# Patient Record
Sex: Female | Born: 1970 | Race: White | Hispanic: No | Marital: Single
Health system: Southern US, Community
[De-identification: ages and names within clinical notes are randomized; demographics above are authoritative.]

---

## 2012-10-09 ENCOUNTER — Other Ambulatory Visit (HOSPITAL_COMMUNITY)
Admission: RE | Admit: 2012-10-09 | Discharge: 2012-10-09 | Disposition: A | Discharge status: Home / Self Care | Payer: 59 | Source: Ambulatory Visit | Attending: Family Medicine | Admitting: Family Medicine

## 2012-10-09 ENCOUNTER — Other Ambulatory Visit: Payer: Self-pay | Admitting: Family Medicine

## 2012-10-09 DIAGNOSIS — Z124 Encounter for screening for malignant neoplasm of cervix: Secondary | ICD-10-CM | POA: Insufficient documentation

## 2012-10-09 DIAGNOSIS — Z1151 Encounter for screening for human papillomavirus (HPV): Secondary | ICD-10-CM | POA: Insufficient documentation

## 2012-10-10 ENCOUNTER — Other Ambulatory Visit: Payer: Self-pay | Admitting: Family Medicine

## 2012-10-10 DIAGNOSIS — R928 Other abnormal and inconclusive findings on diagnostic imaging of breast: Secondary | ICD-10-CM

## 2012-10-26 ENCOUNTER — Ambulatory Visit
Admission: RE | Admit: 2012-10-26 | Discharge: 2012-10-26 | Disposition: A | Discharge status: Home / Self Care | Payer: 59 | Source: Ambulatory Visit | Attending: Family Medicine | Admitting: Family Medicine

## 2012-10-26 DIAGNOSIS — R928 Other abnormal and inconclusive findings on diagnostic imaging of breast: Secondary | ICD-10-CM

## 2013-10-15 ENCOUNTER — Other Ambulatory Visit: Payer: Self-pay | Admitting: Family Medicine

## 2013-10-16 ENCOUNTER — Other Ambulatory Visit: Payer: Self-pay | Admitting: Family Medicine

## 2013-10-16 DIAGNOSIS — R921 Mammographic calcification found on diagnostic imaging of breast: Secondary | ICD-10-CM

## 2013-10-21 ENCOUNTER — Ambulatory Visit
Admission: RE | Admit: 2013-10-21 | Discharge: 2013-10-21 | Disposition: A | Discharge status: Home / Self Care | Payer: 59 | Source: Ambulatory Visit | Attending: Family Medicine | Admitting: Family Medicine

## 2013-10-21 ENCOUNTER — Encounter (INDEPENDENT_AMBULATORY_CARE_PROVIDER_SITE_OTHER): Payer: Self-pay

## 2013-10-21 ENCOUNTER — Other Ambulatory Visit: Payer: Self-pay | Admitting: Family Medicine

## 2013-10-21 DIAGNOSIS — R921 Mammographic calcification found on diagnostic imaging of breast: Secondary | ICD-10-CM

## 2014-04-27 IMAGING — MG MM DIAGNOSTIC UNILATERAL R
3 series · 3 of 3 positions shown · non-contrast
Comparison: 08/24/2012.

CLINICAL DATA: Called back from baseline mobile screening
mammography, right breast calcifications only visualized in the MLO
projection.

DIGITAL DIAGNOSTIC RIGHT MAMMOGRAM WITH CAD

[R CC]
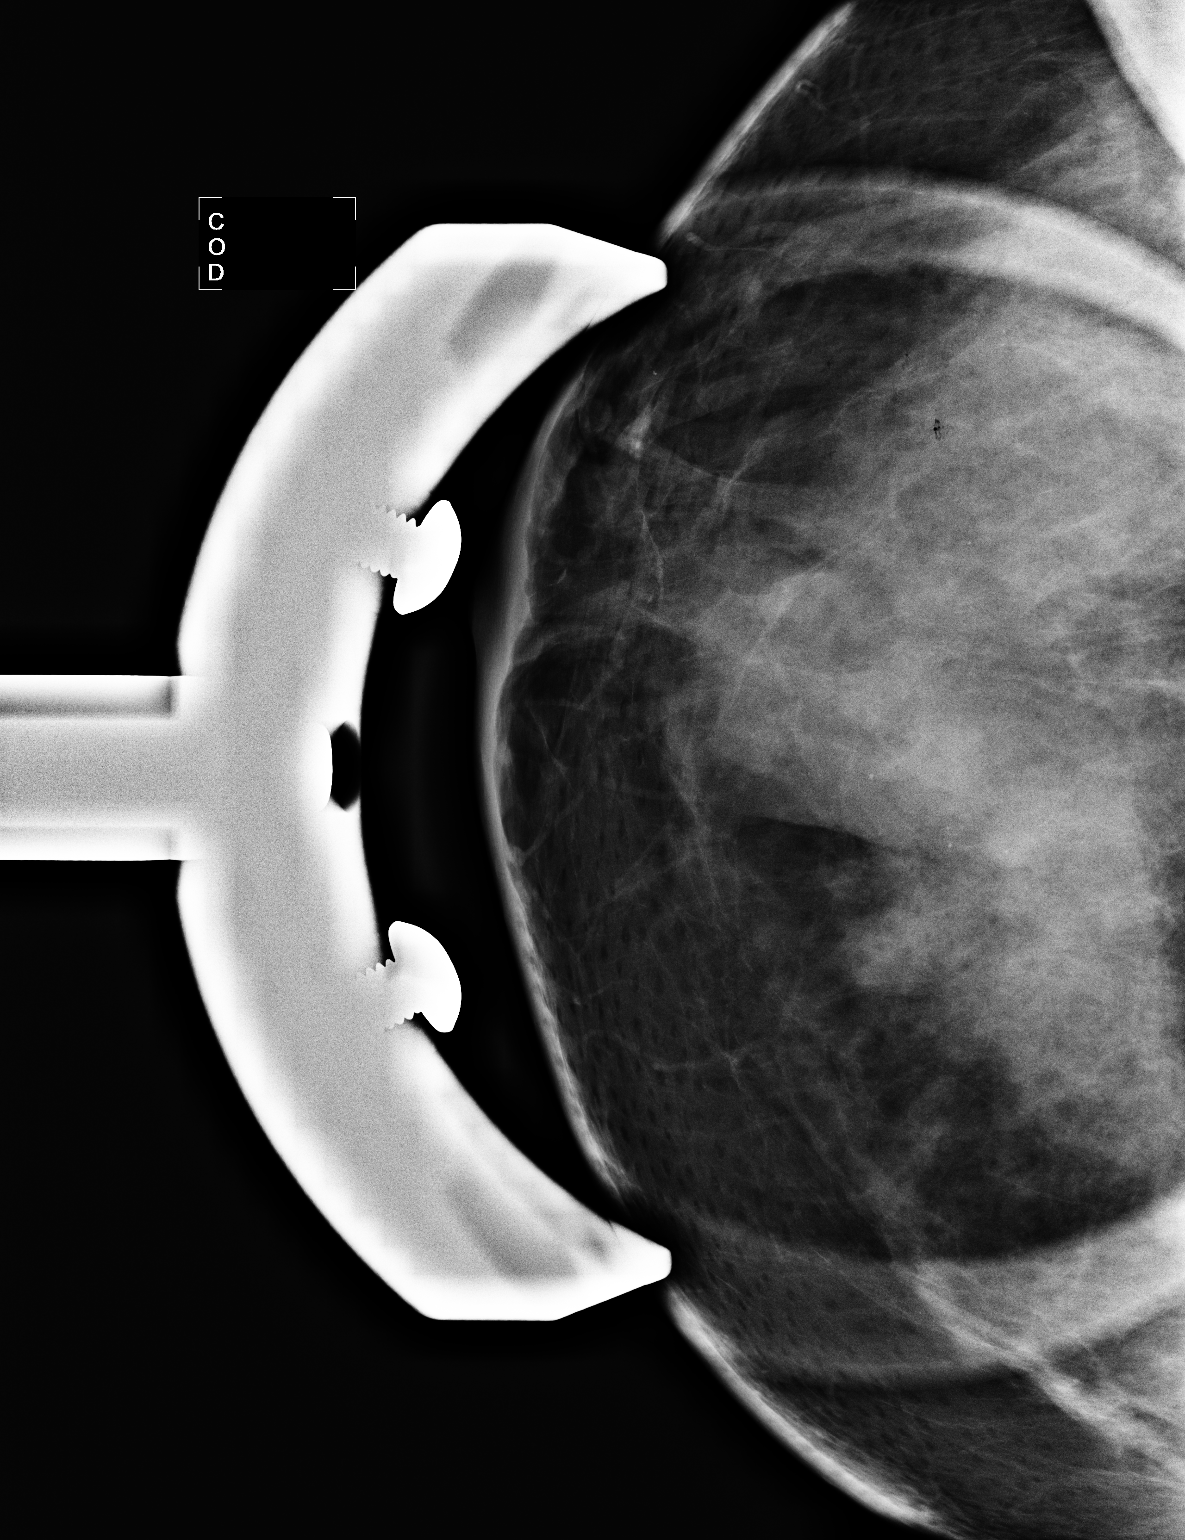

[R ML]
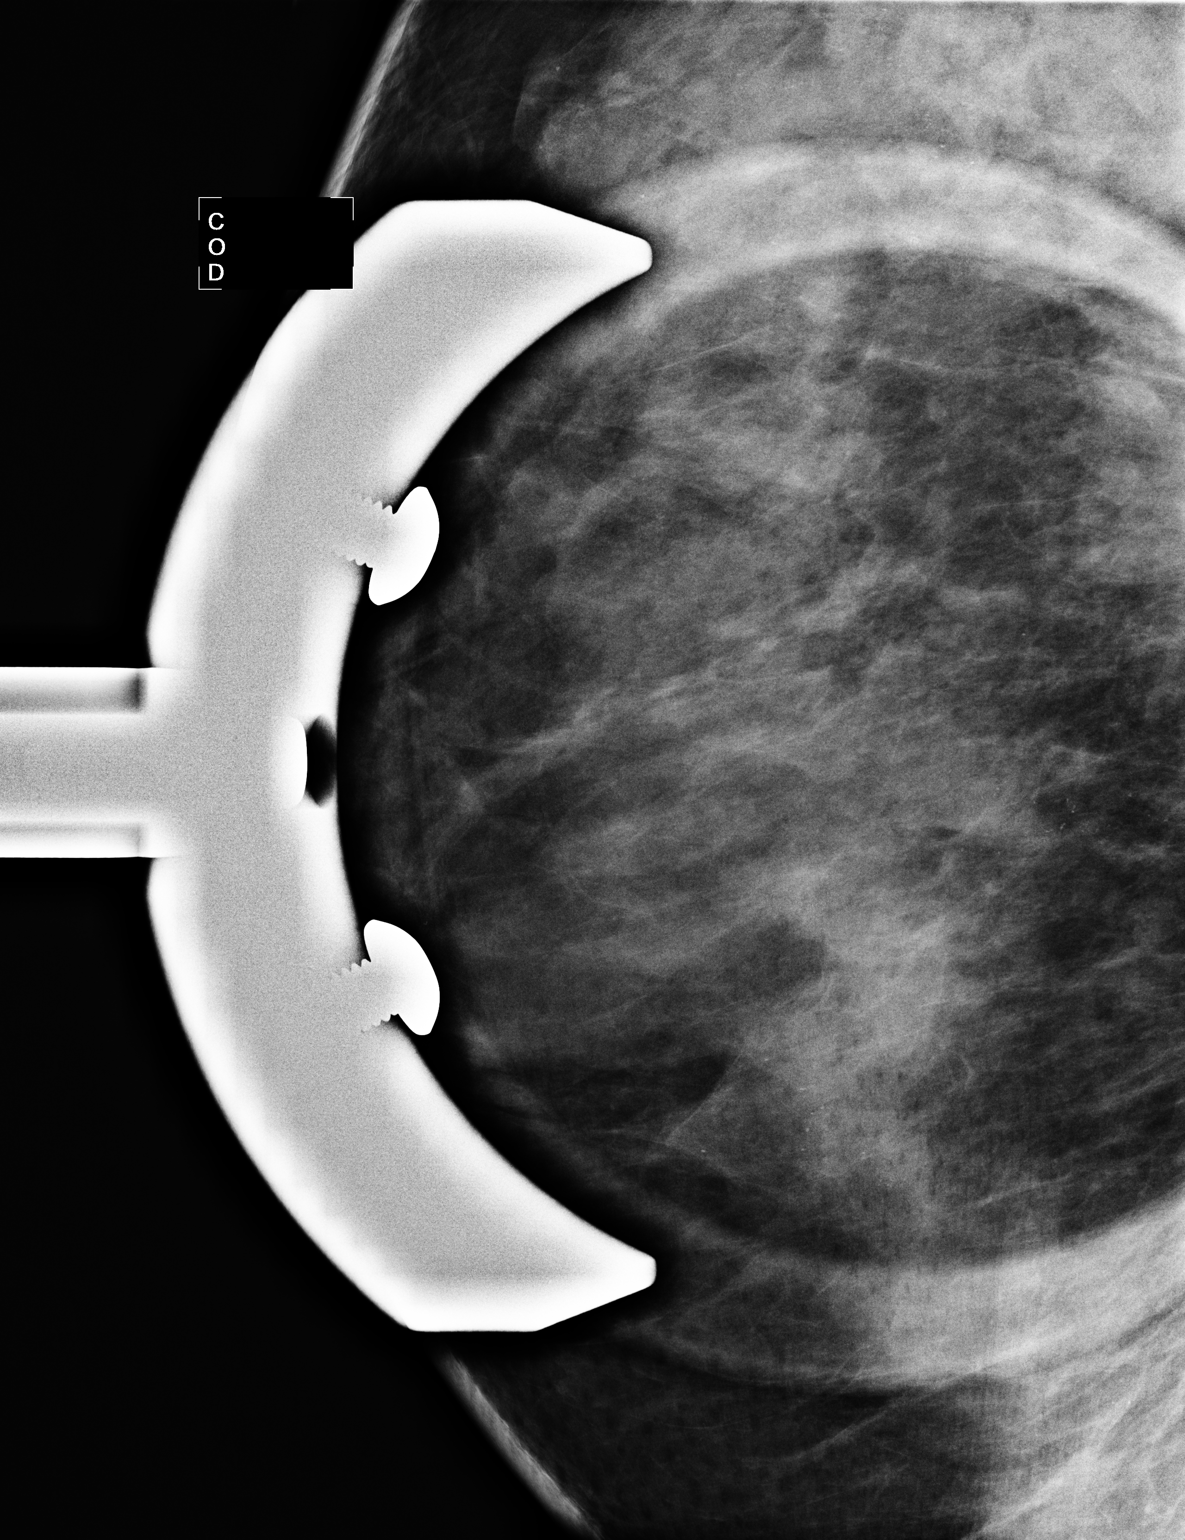

[R XCCL]
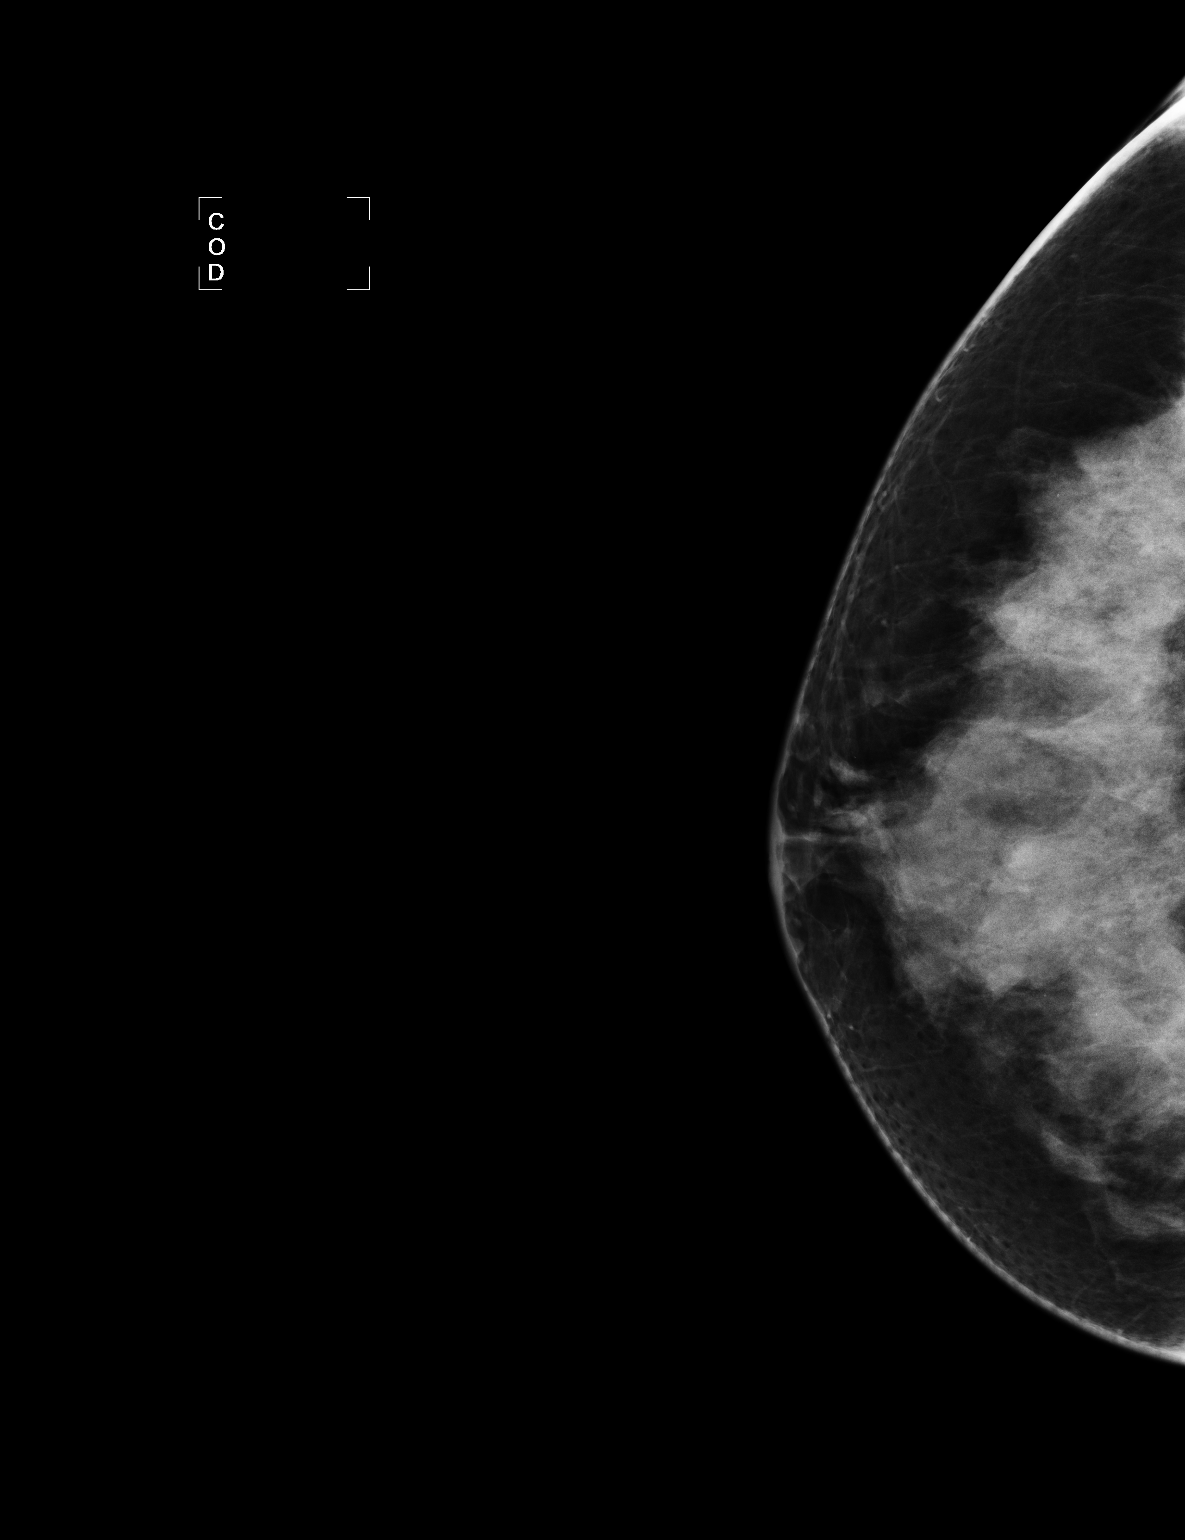

[3 of 3 positions shown; findings below may reference images not displayed]

FINDINGS: ACR Breast Density Category d:  The breast tissue is extremely
dense, which lowers the sensitivity of mammography.

Spot magnification CC and ML views of the calcifications in the
posterior third of the retroareolar right breast and a laterally
exaggerated CC view of the right breast were obtained.  The
calcifications are loosely grouped, faint and punctate on the CC
view, and many demonstrate layering on the true lateral view.  I do
not identify suspicious linear or branching forms.

The laterally exaggerated CC view was processed with CAD.
IMPRESSION: Likely benign calcifications in the posterior third of the
retroareolar right breast.

RECOMMENDATION:
Diagnostic right mammogram and spot magnification views of the
calcifications in 6 months.

I have discussed the findings and recommendations with the patient.
Results were also provided in writing at the conclusion of the
visit.  If applicable, a reminder letter will be sent to the
patient regarding her next appointment.

BI-RADS CATEGORY 3:  Probably benign finding(s) - short interval
follow-up suggested.

## 2014-10-23 ENCOUNTER — Other Ambulatory Visit: Payer: Self-pay | Admitting: Family Medicine

## 2014-10-23 DIAGNOSIS — R921 Mammographic calcification found on diagnostic imaging of breast: Secondary | ICD-10-CM

## 2014-10-24 ENCOUNTER — Ambulatory Visit
Admission: RE | Admit: 2014-10-24 | Discharge: 2014-10-24 | Disposition: A | Discharge status: Home / Self Care | Payer: 59 | Source: Ambulatory Visit | Attending: Family Medicine | Admitting: Family Medicine

## 2014-10-24 DIAGNOSIS — R921 Mammographic calcification found on diagnostic imaging of breast: Secondary | ICD-10-CM

## 2016-04-05 DIAGNOSIS — Z719 Counseling, unspecified: Secondary | ICD-10-CM | POA: Diagnosis not present

## 2016-09-20 ENCOUNTER — Other Ambulatory Visit: Payer: Self-pay | Admitting: Family Medicine

## 2016-09-20 DIAGNOSIS — Z1231 Encounter for screening mammogram for malignant neoplasm of breast: Secondary | ICD-10-CM

## 2016-09-30 ENCOUNTER — Ambulatory Visit
Admission: RE | Admit: 2016-09-30 | Discharge: 2016-09-30 | Disposition: A | Discharge status: Home / Self Care | Payer: 59 | Source: Ambulatory Visit | Attending: Family Medicine | Admitting: Family Medicine

## 2016-09-30 DIAGNOSIS — Z1231 Encounter for screening mammogram for malignant neoplasm of breast: Secondary | ICD-10-CM

## 2016-10-19 DIAGNOSIS — J069 Acute upper respiratory infection, unspecified: Secondary | ICD-10-CM | POA: Diagnosis not present

## 2016-10-28 DIAGNOSIS — Z0000 Encounter for general adult medical examination without abnormal findings: Secondary | ICD-10-CM | POA: Diagnosis not present

## 2016-12-02 DIAGNOSIS — Z01411 Encounter for gynecological examination (general) (routine) with abnormal findings: Secondary | ICD-10-CM | POA: Diagnosis not present

## 2017-04-28 DIAGNOSIS — M722 Plantar fascial fibromatosis: Secondary | ICD-10-CM | POA: Diagnosis not present

## 2017-04-28 DIAGNOSIS — J310 Chronic rhinitis: Secondary | ICD-10-CM | POA: Diagnosis not present

## 2017-08-21 DIAGNOSIS — M542 Cervicalgia: Secondary | ICD-10-CM | POA: Diagnosis not present

## 2017-08-21 DIAGNOSIS — M9901 Segmental and somatic dysfunction of cervical region: Secondary | ICD-10-CM | POA: Diagnosis not present

## 2017-08-21 DIAGNOSIS — M9902 Segmental and somatic dysfunction of thoracic region: Secondary | ICD-10-CM | POA: Diagnosis not present

## 2017-08-24 DIAGNOSIS — M9902 Segmental and somatic dysfunction of thoracic region: Secondary | ICD-10-CM | POA: Diagnosis not present

## 2017-08-24 DIAGNOSIS — M542 Cervicalgia: Secondary | ICD-10-CM | POA: Diagnosis not present

## 2017-08-24 DIAGNOSIS — M9901 Segmental and somatic dysfunction of cervical region: Secondary | ICD-10-CM | POA: Diagnosis not present

## 2017-08-29 DIAGNOSIS — M9902 Segmental and somatic dysfunction of thoracic region: Secondary | ICD-10-CM | POA: Diagnosis not present

## 2017-08-29 DIAGNOSIS — M542 Cervicalgia: Secondary | ICD-10-CM | POA: Diagnosis not present

## 2017-08-29 DIAGNOSIS — M9901 Segmental and somatic dysfunction of cervical region: Secondary | ICD-10-CM | POA: Diagnosis not present

## 2017-08-31 DIAGNOSIS — M542 Cervicalgia: Secondary | ICD-10-CM | POA: Diagnosis not present

## 2017-08-31 DIAGNOSIS — M9901 Segmental and somatic dysfunction of cervical region: Secondary | ICD-10-CM | POA: Diagnosis not present

## 2017-08-31 DIAGNOSIS — M9902 Segmental and somatic dysfunction of thoracic region: Secondary | ICD-10-CM | POA: Diagnosis not present

## 2017-09-05 DIAGNOSIS — M542 Cervicalgia: Secondary | ICD-10-CM | POA: Diagnosis not present

## 2017-09-05 DIAGNOSIS — M9901 Segmental and somatic dysfunction of cervical region: Secondary | ICD-10-CM | POA: Diagnosis not present

## 2017-09-05 DIAGNOSIS — M9902 Segmental and somatic dysfunction of thoracic region: Secondary | ICD-10-CM | POA: Diagnosis not present

## 2017-09-07 DIAGNOSIS — M9902 Segmental and somatic dysfunction of thoracic region: Secondary | ICD-10-CM | POA: Diagnosis not present

## 2017-09-07 DIAGNOSIS — M9901 Segmental and somatic dysfunction of cervical region: Secondary | ICD-10-CM | POA: Diagnosis not present

## 2017-09-07 DIAGNOSIS — M542 Cervicalgia: Secondary | ICD-10-CM | POA: Diagnosis not present

## 2017-09-12 DIAGNOSIS — M9901 Segmental and somatic dysfunction of cervical region: Secondary | ICD-10-CM | POA: Diagnosis not present

## 2017-09-12 DIAGNOSIS — M542 Cervicalgia: Secondary | ICD-10-CM | POA: Diagnosis not present

## 2017-09-12 DIAGNOSIS — M9902 Segmental and somatic dysfunction of thoracic region: Secondary | ICD-10-CM | POA: Diagnosis not present

## 2017-09-14 DIAGNOSIS — M9902 Segmental and somatic dysfunction of thoracic region: Secondary | ICD-10-CM | POA: Diagnosis not present

## 2017-09-14 DIAGNOSIS — M9901 Segmental and somatic dysfunction of cervical region: Secondary | ICD-10-CM | POA: Diagnosis not present

## 2017-09-14 DIAGNOSIS — M542 Cervicalgia: Secondary | ICD-10-CM | POA: Diagnosis not present

## 2017-09-19 DIAGNOSIS — M9901 Segmental and somatic dysfunction of cervical region: Secondary | ICD-10-CM | POA: Diagnosis not present

## 2017-09-19 DIAGNOSIS — M542 Cervicalgia: Secondary | ICD-10-CM | POA: Diagnosis not present

## 2017-09-19 DIAGNOSIS — R293 Abnormal posture: Secondary | ICD-10-CM | POA: Diagnosis not present

## 2017-09-19 DIAGNOSIS — M546 Pain in thoracic spine: Secondary | ICD-10-CM | POA: Diagnosis not present

## 2017-09-19 DIAGNOSIS — M9902 Segmental and somatic dysfunction of thoracic region: Secondary | ICD-10-CM | POA: Diagnosis not present

## 2017-09-21 DIAGNOSIS — M546 Pain in thoracic spine: Secondary | ICD-10-CM | POA: Diagnosis not present

## 2017-09-21 DIAGNOSIS — R293 Abnormal posture: Secondary | ICD-10-CM | POA: Diagnosis not present

## 2017-09-21 DIAGNOSIS — M9902 Segmental and somatic dysfunction of thoracic region: Secondary | ICD-10-CM | POA: Diagnosis not present

## 2017-09-26 DIAGNOSIS — M9902 Segmental and somatic dysfunction of thoracic region: Secondary | ICD-10-CM | POA: Diagnosis not present

## 2017-09-26 DIAGNOSIS — R293 Abnormal posture: Secondary | ICD-10-CM | POA: Diagnosis not present

## 2017-09-26 DIAGNOSIS — M546 Pain in thoracic spine: Secondary | ICD-10-CM | POA: Diagnosis not present

## 2017-10-05 DIAGNOSIS — M9902 Segmental and somatic dysfunction of thoracic region: Secondary | ICD-10-CM | POA: Diagnosis not present

## 2017-10-05 DIAGNOSIS — R293 Abnormal posture: Secondary | ICD-10-CM | POA: Diagnosis not present

## 2017-10-05 DIAGNOSIS — M546 Pain in thoracic spine: Secondary | ICD-10-CM | POA: Diagnosis not present

## 2017-11-03 DIAGNOSIS — Z0000 Encounter for general adult medical examination without abnormal findings: Secondary | ICD-10-CM | POA: Diagnosis not present

## 2017-11-03 DIAGNOSIS — Z136 Encounter for screening for cardiovascular disorders: Secondary | ICD-10-CM | POA: Diagnosis not present

## 2017-12-20 ENCOUNTER — Other Ambulatory Visit: Payer: Self-pay | Admitting: Family Medicine

## 2017-12-20 DIAGNOSIS — Z1231 Encounter for screening mammogram for malignant neoplasm of breast: Secondary | ICD-10-CM

## 2018-01-03 ENCOUNTER — Ambulatory Visit
Admission: RE | Admit: 2018-01-03 | Discharge: 2018-01-03 | Disposition: A | Discharge status: Home / Self Care | Payer: 59 | Source: Ambulatory Visit | Attending: Family Medicine | Admitting: Family Medicine

## 2018-01-03 DIAGNOSIS — Z1231 Encounter for screening mammogram for malignant neoplasm of breast: Secondary | ICD-10-CM

## 2019-12-24 ENCOUNTER — Other Ambulatory Visit: Payer: Self-pay | Admitting: Family Medicine

## 2019-12-24 DIAGNOSIS — Z1231 Encounter for screening mammogram for malignant neoplasm of breast: Secondary | ICD-10-CM

## 2019-12-27 ENCOUNTER — Other Ambulatory Visit: Payer: Self-pay

## 2019-12-27 ENCOUNTER — Ambulatory Visit
Admission: RE | Admit: 2019-12-27 | Discharge: 2019-12-27 | Disposition: A | Discharge status: Home / Self Care | Payer: PRIVATE HEALTH INSURANCE | Source: Ambulatory Visit | Attending: Family Medicine | Admitting: Family Medicine

## 2019-12-27 DIAGNOSIS — Z1231 Encounter for screening mammogram for malignant neoplasm of breast: Secondary | ICD-10-CM

## 2020-01-01 ENCOUNTER — Other Ambulatory Visit: Payer: Self-pay | Admitting: Family Medicine

## 2020-01-01 DIAGNOSIS — R928 Other abnormal and inconclusive findings on diagnostic imaging of breast: Secondary | ICD-10-CM

## 2020-01-15 ENCOUNTER — Other Ambulatory Visit: Payer: Self-pay

## 2020-01-15 ENCOUNTER — Ambulatory Visit
Admission: RE | Admit: 2020-01-15 | Discharge: 2020-01-15 | Disposition: A | Discharge status: Home / Self Care | Payer: PRIVATE HEALTH INSURANCE | Source: Ambulatory Visit | Attending: Family Medicine | Admitting: Family Medicine

## 2020-01-15 DIAGNOSIS — R928 Other abnormal and inconclusive findings on diagnostic imaging of breast: Secondary | ICD-10-CM

## 2021-01-12 ENCOUNTER — Other Ambulatory Visit: Payer: Self-pay | Admitting: Emergency Medicine

## 2021-01-12 DIAGNOSIS — Z1231 Encounter for screening mammogram for malignant neoplasm of breast: Secondary | ICD-10-CM

## 2021-02-12 ENCOUNTER — Ambulatory Visit
Admission: RE | Admit: 2021-02-12 | Discharge: 2021-02-12 | Disposition: A | Discharge status: Home / Self Care | Payer: PRIVATE HEALTH INSURANCE | Source: Ambulatory Visit | Attending: Emergency Medicine | Admitting: Emergency Medicine

## 2021-02-12 ENCOUNTER — Other Ambulatory Visit: Payer: Self-pay

## 2021-02-12 DIAGNOSIS — Z1231 Encounter for screening mammogram for malignant neoplasm of breast: Secondary | ICD-10-CM

## 2021-02-17 ENCOUNTER — Other Ambulatory Visit: Payer: Self-pay | Admitting: Emergency Medicine

## 2021-02-17 DIAGNOSIS — R928 Other abnormal and inconclusive findings on diagnostic imaging of breast: Secondary | ICD-10-CM

## 2021-03-12 ENCOUNTER — Ambulatory Visit
Admission: RE | Admit: 2021-03-12 | Discharge: 2021-03-12 | Disposition: A | Discharge status: Home / Self Care | Payer: PRIVATE HEALTH INSURANCE | Source: Ambulatory Visit | Attending: Emergency Medicine | Admitting: Emergency Medicine

## 2021-03-12 ENCOUNTER — Other Ambulatory Visit: Payer: Self-pay

## 2021-03-12 DIAGNOSIS — R928 Other abnormal and inconclusive findings on diagnostic imaging of breast: Secondary | ICD-10-CM

## 2022-06-21 ENCOUNTER — Other Ambulatory Visit: Payer: Self-pay | Admitting: Emergency Medicine

## 2022-06-21 DIAGNOSIS — Z1231 Encounter for screening mammogram for malignant neoplasm of breast: Secondary | ICD-10-CM

## 2022-07-07 ENCOUNTER — Ambulatory Visit
Admission: RE | Admit: 2022-07-07 | Discharge: 2022-07-07 | Disposition: A | Discharge status: Home / Self Care | Payer: PRIVATE HEALTH INSURANCE | Source: Ambulatory Visit | Attending: Emergency Medicine | Admitting: Emergency Medicine

## 2022-07-07 DIAGNOSIS — Z1231 Encounter for screening mammogram for malignant neoplasm of breast: Secondary | ICD-10-CM

## 2022-07-12 ENCOUNTER — Other Ambulatory Visit: Payer: Self-pay | Admitting: Emergency Medicine

## 2022-07-12 DIAGNOSIS — R928 Other abnormal and inconclusive findings on diagnostic imaging of breast: Secondary | ICD-10-CM

## 2022-07-22 ENCOUNTER — Ambulatory Visit
Admission: RE | Admit: 2022-07-22 | Discharge: 2022-07-22 | Disposition: A | Discharge status: Home / Self Care | Payer: PRIVATE HEALTH INSURANCE | Source: Ambulatory Visit | Attending: Emergency Medicine | Admitting: Emergency Medicine

## 2022-07-22 ENCOUNTER — Ambulatory Visit: Payer: PRIVATE HEALTH INSURANCE

## 2022-07-22 DIAGNOSIS — R928 Other abnormal and inconclusive findings on diagnostic imaging of breast: Secondary | ICD-10-CM

## 2022-09-11 IMAGING — MG MM DIGITAL DIAGNOSTIC UNILAT*R*
3 series · 3 of 3 positions shown · non-contrast
Comparison: Previous exam(s).

CLINICAL DATA: Screening recall for right breast calcifications.

EXAM:
DIGITAL DIAGNOSTIC UNILATERAL RIGHT MAMMOGRAM
TECHNIQUE: Right digital diagnostic mammography was performed. Mammographic
images were processed with CAD.

[R ML (1 of 2)]
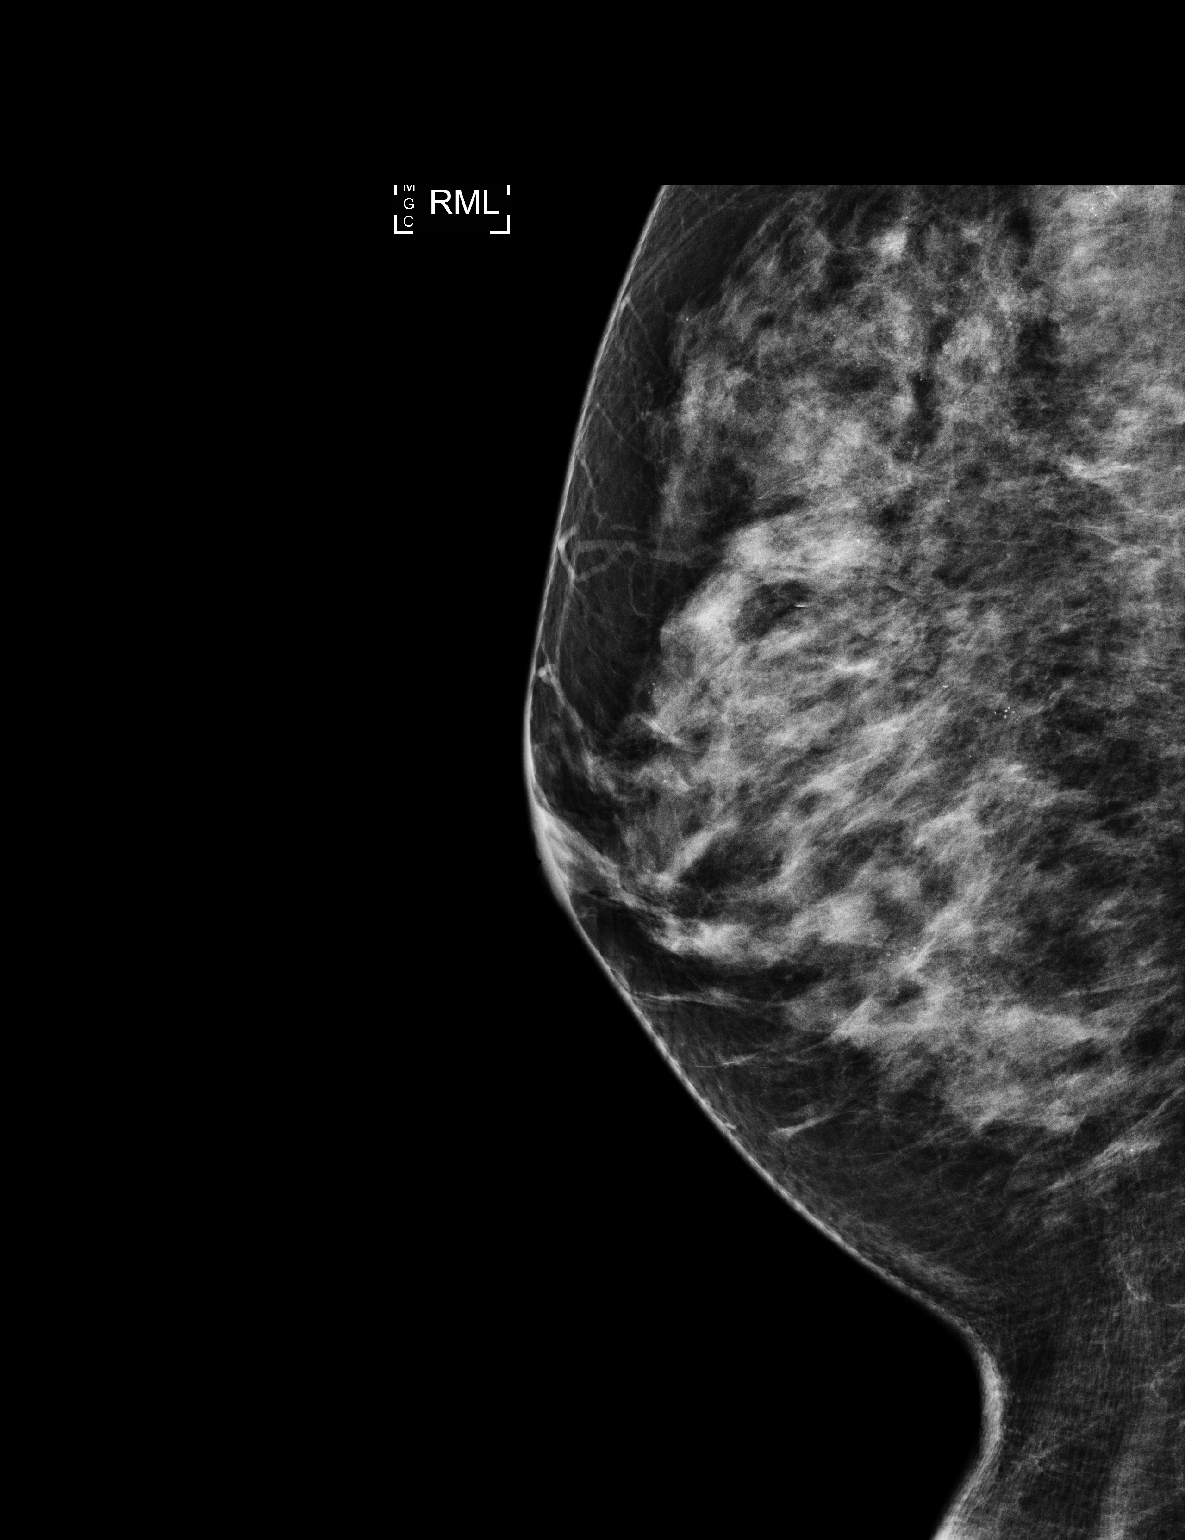

[R ML (2 of 2)]
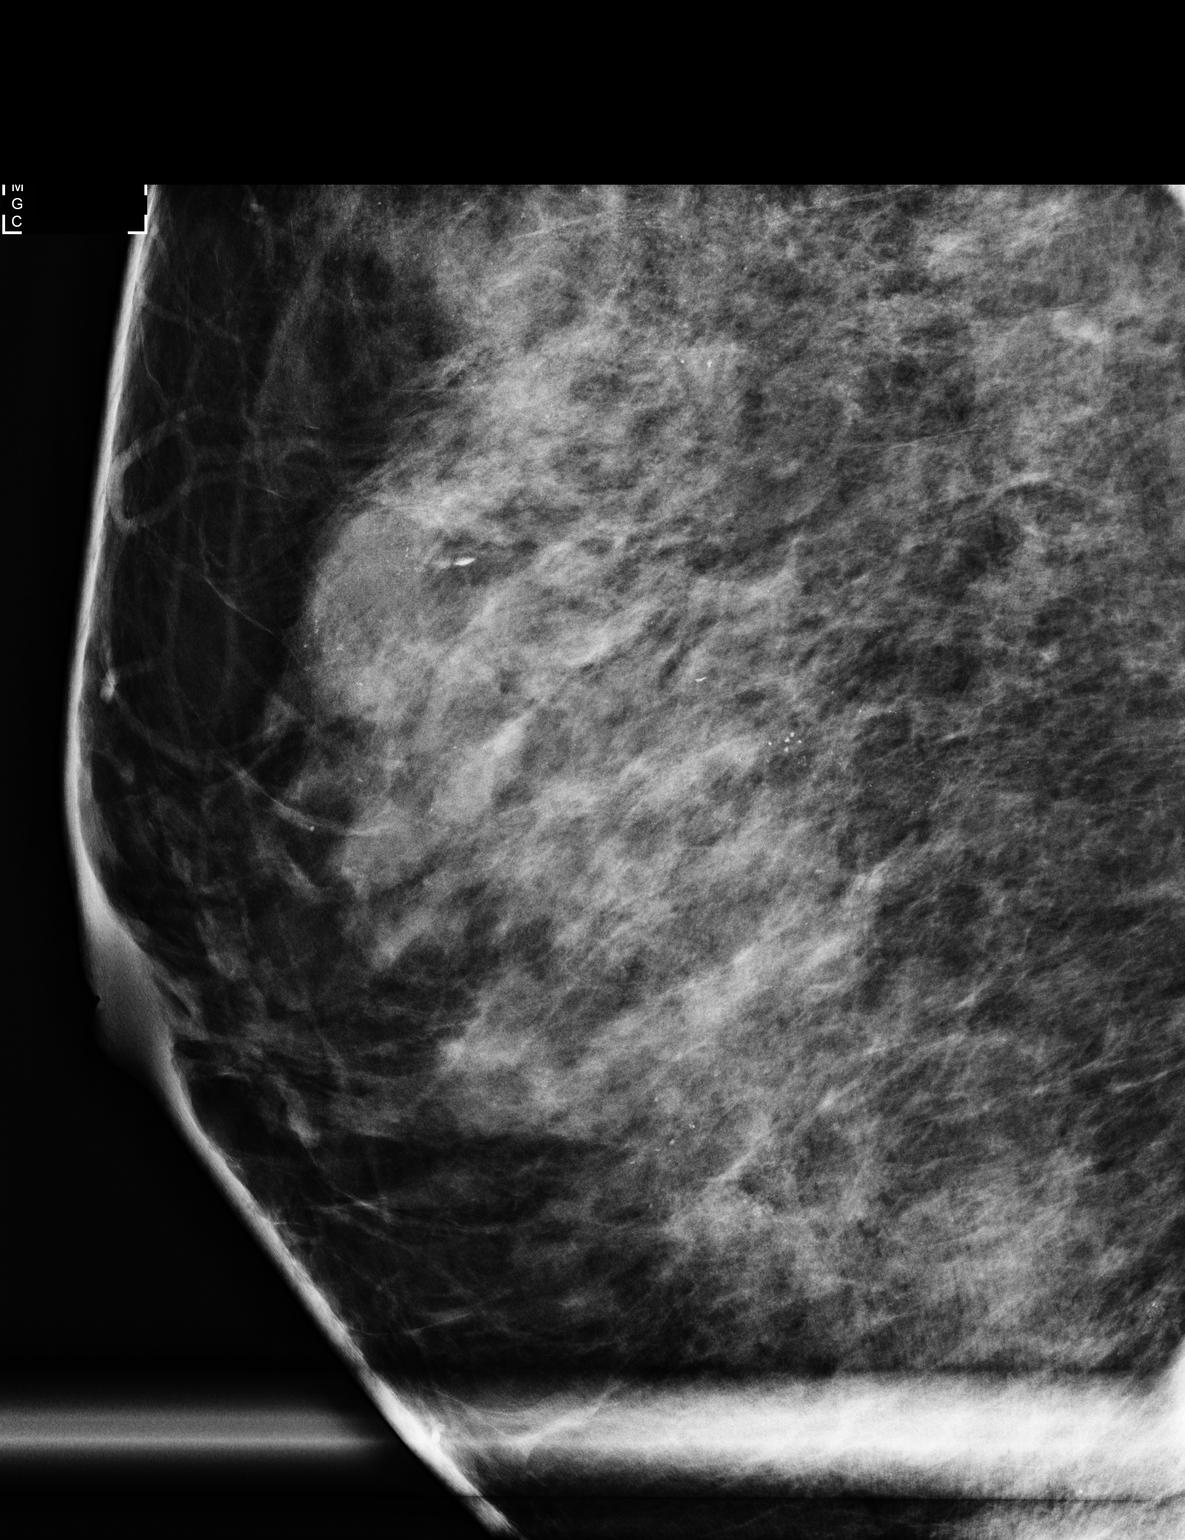

[R CC]
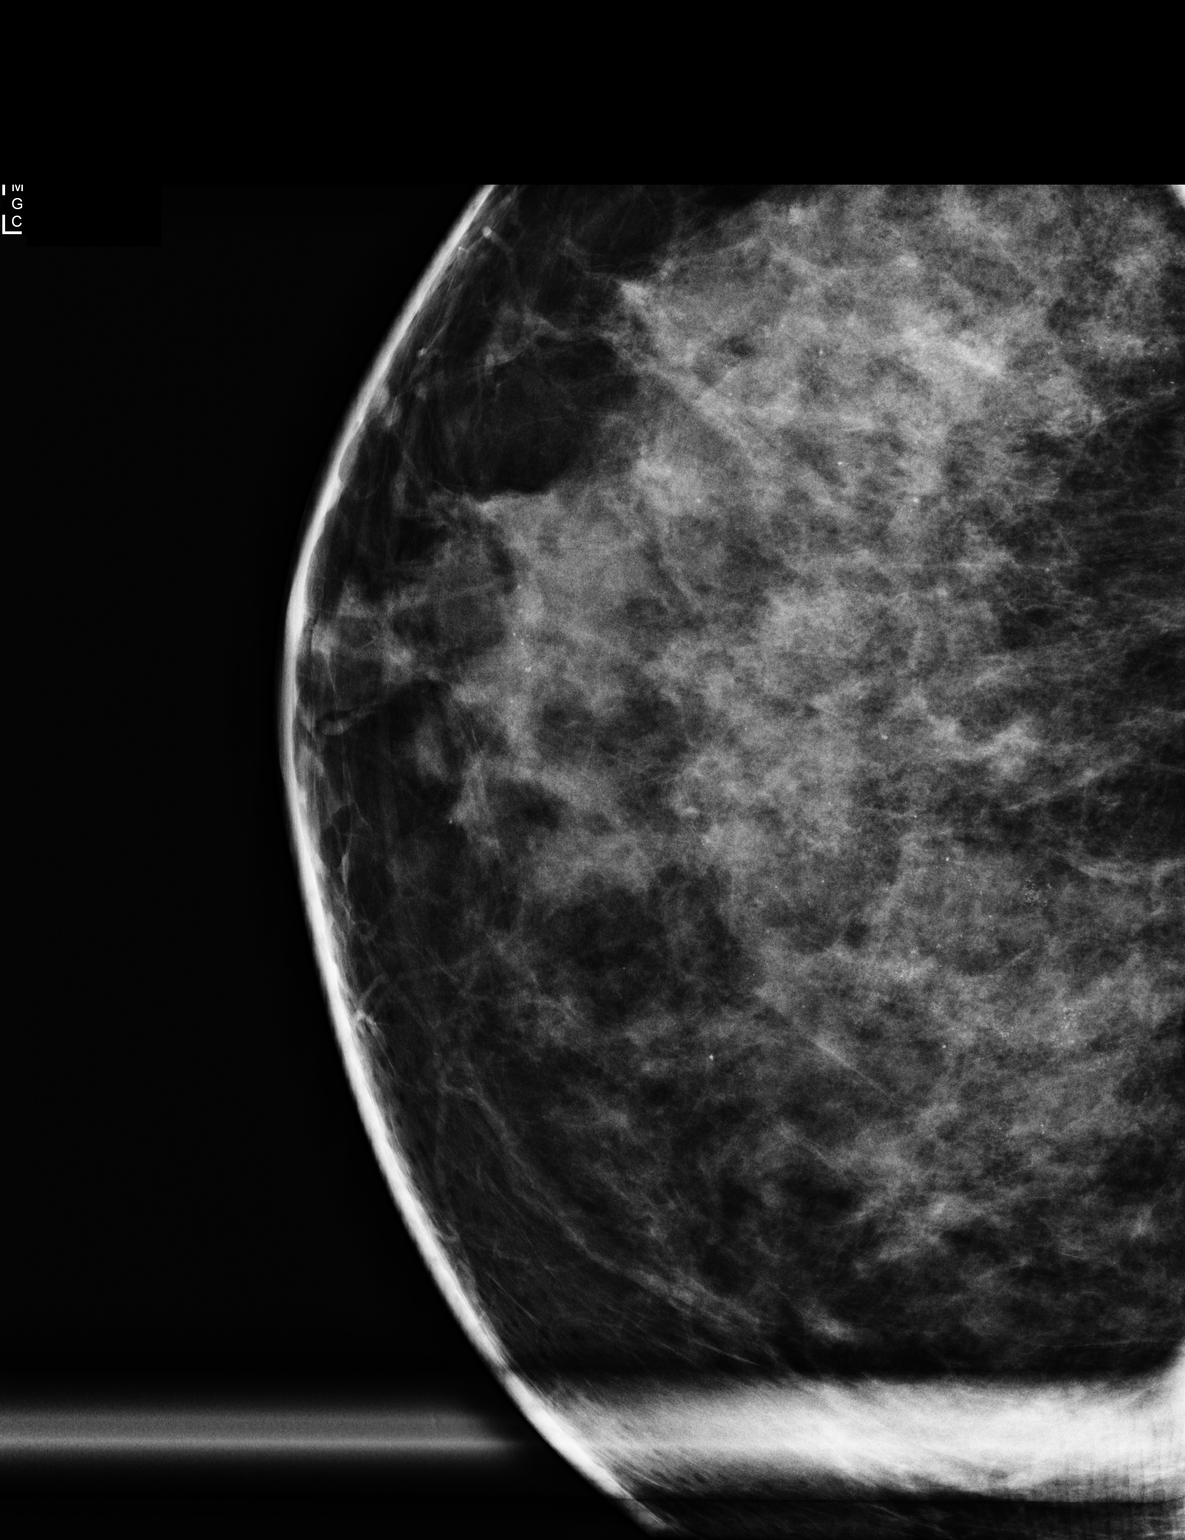

[3 of 3 positions shown; findings below may reference images not displayed]

ACR Breast Density Category c: The breast tissue is heterogeneously
dense, which may obscure small masses.
FINDINGS: The calcifications in the right breast are better imaged with the
magnification views. There are numerous calcifications scattered
throughout the fibroglandular tissue of the right breast, also seen
to a lesser degree on the left. The area of calcifications the
retroareolar breast are small round and punctate in configuration,
with no associated mass or distortion and similar to numerous other
calcifications seen elsewhere. The calcifications in the posterior
aspect of the breast noted on the screening study are also small
round calcifications with no associated mass or distortion and
similar to numerous other calcifications. On the mL images, some of
the right breast calcifications layer consistent with milk of
calcium. Overall, there has been no significant change when compared
to the most recent prior study and the 3753 exam.
IMPRESSION: 1. No evidence of breast malignancy.
2. Benign bilateral breast calcifications.

RECOMMENDATION:
Screening mammogram in one year.(Code:[id number])

I have discussed the findings and recommendations with the patient.
If applicable, a reminder letter will be sent to the patient
regarding the next appointment.

BI-RADS CATEGORY  2: Benign.

## 2023-07-27 ENCOUNTER — Other Ambulatory Visit: Payer: Self-pay | Admitting: Emergency Medicine

## 2023-07-27 DIAGNOSIS — Z1231 Encounter for screening mammogram for malignant neoplasm of breast: Secondary | ICD-10-CM

## 2023-07-28 ENCOUNTER — Ambulatory Visit
Admission: RE | Admit: 2023-07-28 | Discharge: 2023-07-28 | Disposition: A | Discharge status: Home / Self Care | Payer: PRIVATE HEALTH INSURANCE | Source: Ambulatory Visit | Attending: Emergency Medicine | Admitting: Emergency Medicine

## 2023-07-28 DIAGNOSIS — Z1231 Encounter for screening mammogram for malignant neoplasm of breast: Secondary | ICD-10-CM
